# Patient Record
Sex: Female | Born: 1950 | Race: White | Hispanic: No | Marital: Married | Smoking: Never smoker
Health system: Southern US, Community
[De-identification: ages and names within clinical notes are randomized; demographics above are authoritative.]

## PROBLEM LIST (undated history)

## (undated) HISTORY — PX: ABDOMINAL HYSTERECTOMY: SHX81

## (undated) HISTORY — PX: ELBOW FRACTURE SURGERY: SHX616

## (undated) HISTORY — PX: OTHER SURGICAL HISTORY: SHX169

---

## 2009-01-04 ENCOUNTER — Encounter: Payer: Self-pay | Admitting: Emergency Medicine

## 2009-01-04 ENCOUNTER — Ambulatory Visit: Payer: Self-pay | Admitting: Diagnostic Radiology

## 2009-01-05 ENCOUNTER — Inpatient Hospital Stay (HOSPITAL_COMMUNITY): Admission: AD | Admit: 2009-01-05 | Discharge: 2009-01-06 | Payer: Self-pay | Admitting: Orthopaedic Surgery

## 2009-12-14 IMAGING — RF DG ELBOW 2V*R*
1 series · 4 of 4 positions shown · non-contrast
Comparison: 01/04/2009.

CLINICAL DATA: Fracture of the right elbow.

RIGHT ELBOW - 2 VIEW

[Series 1: run · 4 of 4 slices shown]
[im 1/4]
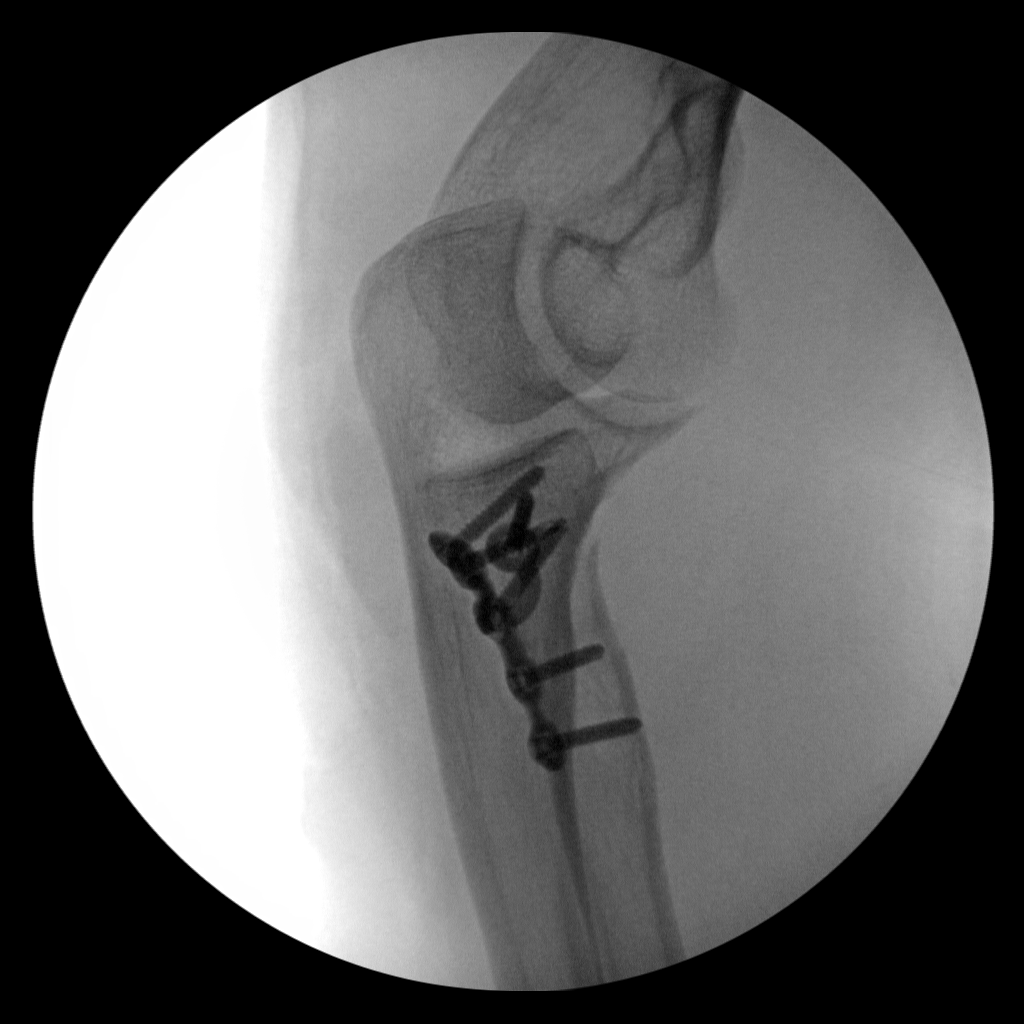
[im 2/4]
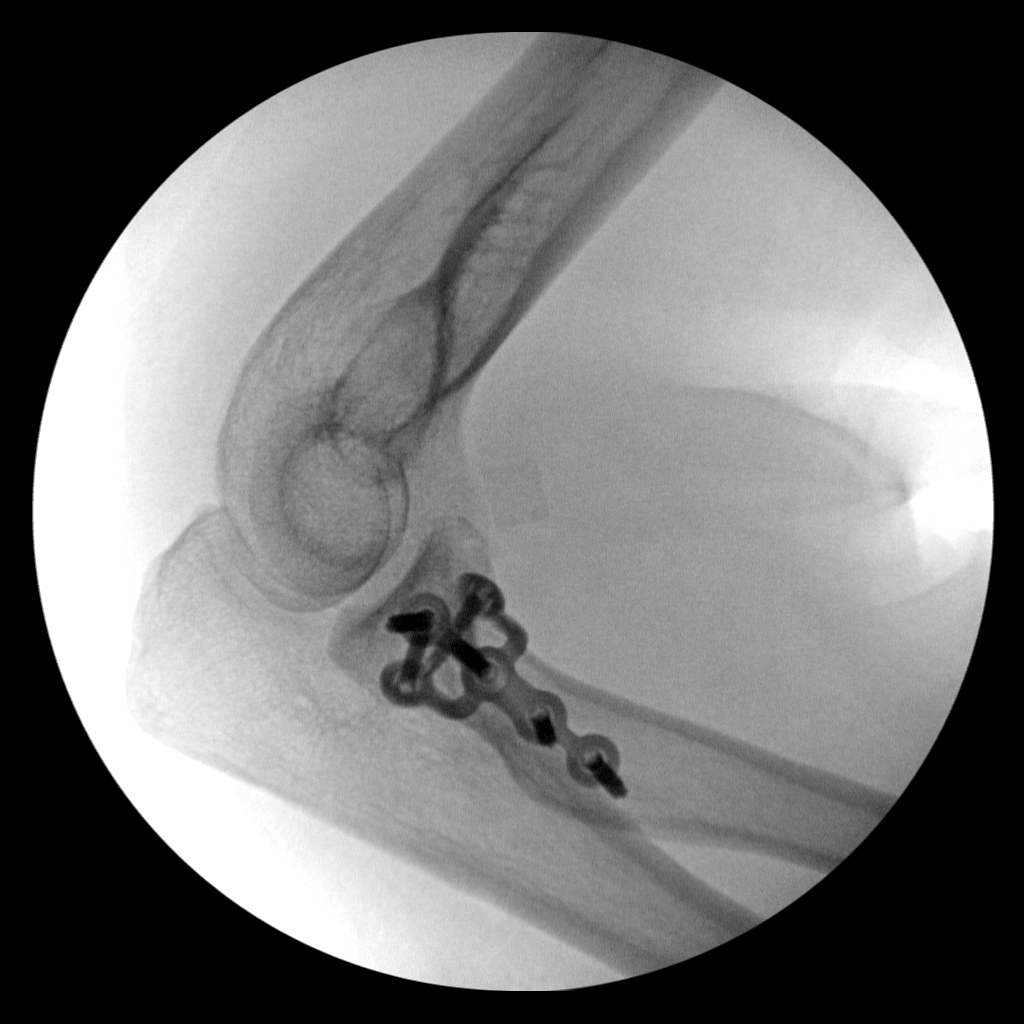
[im 3/4]
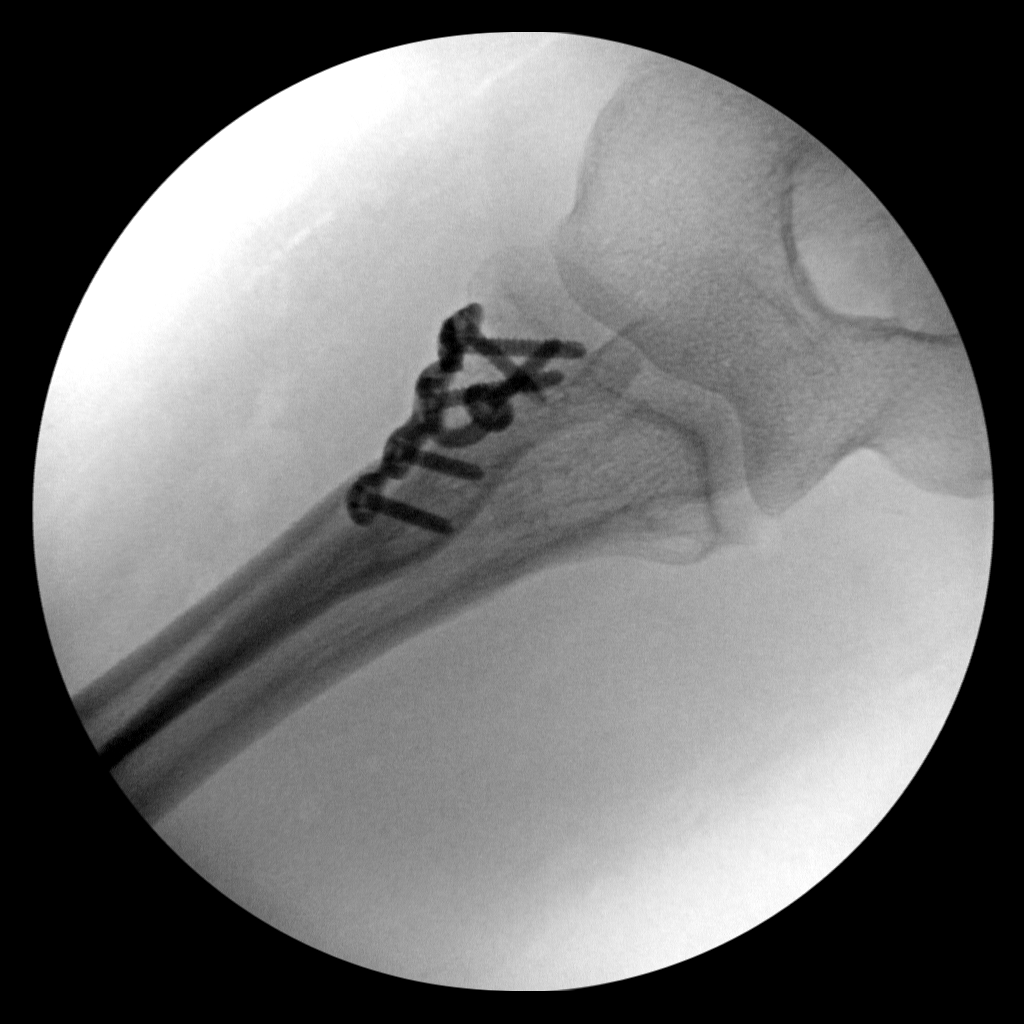
[im 4/4]
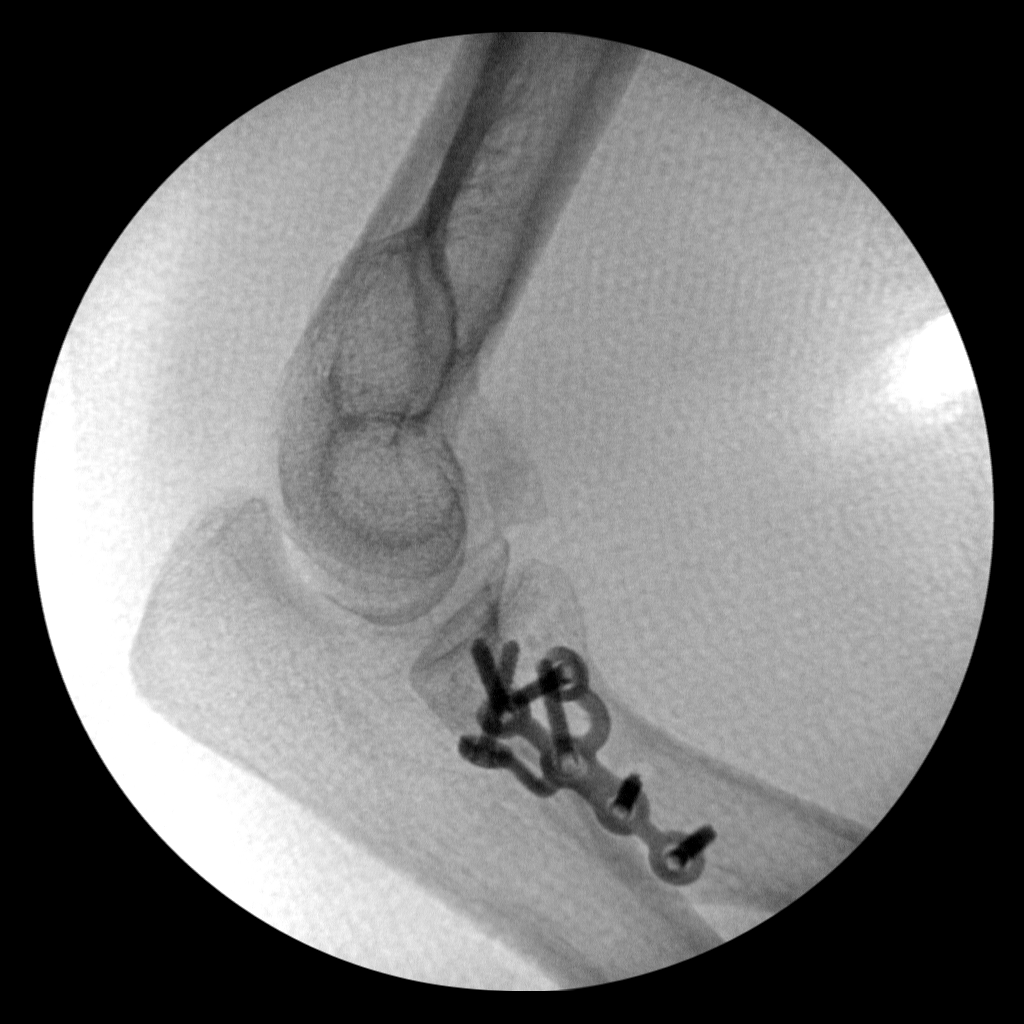

[4 of 4 positions shown; findings below may reference images not displayed]

FINDINGS: ORIF of right radial head fracture is demonstrated on
four intraoperative fluoroscopic spot films.  Alignment is
improved.
IMPRESSION: Right elbow ORIF.

## 2010-08-29 LAB — URINALYSIS, ROUTINE W REFLEX MICROSCOPIC
Bilirubin Urine: NEGATIVE
Ketones, ur: NEGATIVE mg/dL
Nitrite: NEGATIVE
Protein, ur: NEGATIVE mg/dL
pH: 5 (ref 5.0–8.0)

## 2010-08-29 LAB — DIFFERENTIAL
Basophils Absolute: 0.2 10*3/uL — ABNORMAL HIGH (ref 0.0–0.1)
Basophils Relative: 1 % (ref 0–1)
Monocytes Absolute: 0.9 10*3/uL (ref 0.1–1.0)
Neutro Abs: 11.6 10*3/uL — ABNORMAL HIGH (ref 1.7–7.7)
Neutrophils Relative %: 79 % — ABNORMAL HIGH (ref 43–77)

## 2010-08-29 LAB — BASIC METABOLIC PANEL
CO2: 31 mEq/L (ref 19–32)
Calcium: 9.3 mg/dL (ref 8.4–10.5)
Creatinine, Ser: 0.8 mg/dL (ref 0.4–1.2)
GFR calc non Af Amer: >60 mL/min (ref >60)
Glucose, Bld: 108 mg/dL — ABNORMAL HIGH (ref 70–99)

## 2010-08-29 LAB — PROTIME-INR
INR: 1 (ref 0.00–1.49)
Prothrombin Time: 12.7 seconds (ref 11.6–15.2)

## 2010-08-29 LAB — APTT: aPTT: 33 seconds (ref 24–37)

## 2010-08-29 LAB — CBC
MCHC: 33.1 g/dL (ref 30.0–36.0)
Platelets: 378 10*3/uL (ref 150–400)
RDW: 12.9 % (ref 11.5–15.5)

## 2010-08-29 LAB — URINE CULTURE: Colony Count: NO GROWTH

## 2010-10-05 NOTE — Op Note (Signed)
NAMEELAN, Kaylee Scott             ACCOUNT NO.:  [id number]   MEDICAL RECORD NO.:  [id number]          PATIENT TYPE:  INP   LOCATION:  1609                         FACILITY:  [facility]   PHYSICIAN:  Mark C. Ophelia Charter, M.D.    DATE OF BIRTH:  1951-05-21   DATE OF PROCEDURE:  01/05/2009  DATE OF DISCHARGE:                               OPERATIVE REPORT   PREOPERATIVE DIAGNOSIS:  Right elbow dislocation with fracture of radial  head and neck.   POSTOPERATIVE DIAGNOSIS:  Right elbow dislocation with fracture of  radial head and neck.   PROCEDURE:  Reduction right elbow fracture with open reduction internal  fixation of radial head and neck.   SURGEON:  Annell Greening, MD   ANESTHESIA:  General plus 20 mL Marcaine local.   PROCEDURE:  After induction of general anesthesia, surgical time-out  procedure, preoperative vancomycin C-arm was brought in and checked the  elbow with distraction.  It could be reduced.  As the elbow was brought  out toward extension, it would dislocate again and the medial collateral  ligament appeared loose.  The hand was then prepped with pHisoHex  Hibiclens type solution due to the patient's skin sensitivity to  Betadine.  The usual extremity sheets and drapes were applied.  Surgical  time-out checklist was completed, sterile skin marker was used for  planned lateral incision.  The arm was wrapped in Esmarch, tourniquet  inflated.  Lateral incision was made, extensor muscles were split  directly over the radial head and Homans were placed around the neck.  There was comminuted fracture of the head and neck with some shortening.  With Homan behind it, initially bone impactor was used to try to help  straighten it which was not effective.  Two Homans were placed around  the neck and then they were crossed for counter pressure and then with  rotation of the head to the area of maximum deformity, thumb pressure  was used with both my thumbs pushing on the bone and this  pushed the  radial head and neck, back into good position.  Once it was reduced,  DePuy anatomic small plate was selected.  K-wire was placed.  It was  checked under fluoroscopy, adjusted and then holes were drilled.  Additionally the most distal screw had a non tapping screw placed to  suck it down.  Next the remaining locking screws were filled and the  distal-most screw was checked under fluoro, exchanged from a nonlocking  to locking screw since it was about 2 mm too long at 18 mm.  Initial  measurement was based on the fact that the end of the plate was still  slightly off of the radius.  Once it was stuck down and alignment and  position was corrected, multiple spot fluoro pictures were taken showing  satisfactory alignment of the radial head.  With extension that would  pop out again.  The medial collateral ligament was loose from the  dislocation injury and with the joint open under direct visualization,  the joint was reduced flexed 90 and then after irrigation with saline  solution, layered  closure with 2-0 Vicryl in the muscle fascia, 2-0 in  the subcutaneous tissue, 3-0 in the subcutaneous tissue and 4-0  subcuticular skin closure.  Tincture of Benzoin, Steri-Strips, Marcaine  infiltration with 10 mL in the skin incision and 8 mL in the joint.  A  long-arm cast was applied and fluoro spot pictures were taken during the  casting procedure and after cast was applied to make sure that the elbow  was casted  in a reduced position and this was the case.  The patient tolerated the  procedure well, was transferred to the recovery room.  Once the patient  was in the recovery room, cast was split on the dorsal aspect and spread  to allow for any swelling and will be placed in an elevator sling.      Mark C. Ophelia Charter, M.D.  Electronically Signed     MCY/MEDQ  D:  01/05/2009  T:  01/05/2009  Job:  [id number]

## 2016-10-13 ENCOUNTER — Encounter (INDEPENDENT_AMBULATORY_CARE_PROVIDER_SITE_OTHER): Payer: Self-pay | Admitting: Orthopaedic Surgery

## 2016-10-13 ENCOUNTER — Ambulatory Visit (INDEPENDENT_AMBULATORY_CARE_PROVIDER_SITE_OTHER): Payer: Medicare Other | Admitting: Orthopaedic Surgery

## 2016-10-13 VITALS — BP 179/107 | HR 76 | Ht 67.0 in | Wt 230.0 lb

## 2016-10-13 DIAGNOSIS — M170 Bilateral primary osteoarthritis of knee: Secondary | ICD-10-CM

## 2016-10-13 MED ORDER — METHYLPREDNISOLONE ACETATE 40 MG/ML IJ SUSP
40.0000 mg | INTRAMUSCULAR | Status: AC | PRN
  Administered 2016-10-13: 40 mg via INTRA_ARTICULAR

## 2016-10-13 MED ORDER — LIDOCAINE HCL 1 % IJ SOLN
1.0000 mL | INTRAMUSCULAR | Status: AC | PRN
  Administered 2016-10-13: 1 mL

## 2016-10-13 MED ORDER — BUPIVACAINE HCL 0.25 % IJ SOLN
0.6600 mL | INTRAMUSCULAR | Status: AC | PRN
  Administered 2016-10-13: .66 mL via INTRA_ARTICULAR

## 2016-10-13 NOTE — Progress Notes (Signed)
Office Visit Note   Patient: Kaylee MohrJudith Basque           Date of Birth: Dec 16, 1950           MRN: [id number] Visit Date: 10/13/2016              Requested by: No referring provider defined for this encounter. PCP: Patient, No Pcp Per   Assessment & Plan: Visit Diagnoses:  1. Bilateral primary osteoarthritis of knee           With the synovitis.  Plan: bilateral knee injections performed. We discussed various exercises she could do such as swimming, riding and excised by that are less likely to bother her knees. We also discussed doing some core strengthening light upper extremity activities.Returns needed.  Follow-Up Instructions: Return if symptoms worsen or fail to improve.   Orders:  Orders Placed This Encounter  Procedures  . Large Joint Injection/Arthrocentesis  . Large Joint Injection/Arthrocentesis   No orders of the defined types were placed in this encounter.     Procedures: Large Joint Inj Date/Time: 10/13/2016 11:24 AM Performed by: Eldred MangesYATES, Priya Matsen C Authorized by: Annell GreeningYATES, Bettey Muraoka C   Consent Given by:  Patient Indications:  Pain and joint swelling Location:  Knee Site:  R knee Needle Size:  22 G Needle Length:  1.5 inches Approach:  Anterolateral Ultrasound Guidance: No   Fluoroscopic Guidance: No   Arthrogram: No   Medications:  1 mL lidocaine 1 %; 40 mg methylPREDNISolone acetate 40 MG/ML; 0.66 mL bupivacaine 0.25 % Aspiration Attempted: No   Patient tolerance:  Patient tolerated the procedure well with no immediate complications Large Joint Inj Date/Time: 10/13/2016 11:24 AM Performed by: Eldred MangesYATES, Bianey Tesoro C Authorized by: Annell GreeningYATES, Jessiah Steinhart C   Consent Given by:  Patient Indications:  Pain and joint swelling Location:  Knee Site:  L knee Needle Size:  22 G Needle Length:  1.5 inches Approach:  Anterolateral Ultrasound Guidance: No   Fluoroscopic Guidance: No   Arthrogram: No   Medications:  1 mL lidocaine 1 %; 40 mg methylPREDNISolone acetate 40 MG/ML; 0.66 mL  bupivacaine 0.25 % Aspiration Attempted: No   Patient tolerance:  Patient tolerated the procedure well with no immediate complications     Clinical Data: No additional findings.   Subjective: Chief Complaint  Patient presents with  . Left Knee - Pain  . Right Knee - Pain    HPI patient turns for bilateral knee osteoarthritis. Any symptoms of increased she had an injection last year and requested repeat injections in her knees. She is trying to work on increasing activity losing some weight and avoid total knee arthroplasty if possible.  Review of Systems 14 point review of systems updated and unchanged other than history of present illness. She continues been ibuprofen 800 mg.   Objective: Vital Signs: BP (!) 179/107   Pulse 76   Ht 5\' 7"  (1.702 m)   Wt 230 lb (104.3 kg)   BMI 36.02 kg/m   Physical Exam  Constitutional: She is oriented to person, place, and time. She appears well-developed.  HENT:  Head: Normocephalic.  Right Ear: External ear normal.  Left Ear: External ear normal.  Eyes: Pupils are equal, round, and reactive to light.  Neck: No tracheal deviation present. No thyromegaly present.  Cardiovascular: Normal rate.   Pulmonary/Chest: Effort normal.  Abdominal: Soft.  Musculoskeletal:  Patient has no pain with hip range of motion. Negative straight leg raising. Bilateral knee crepitus. 2+ knee synovitis. She has some  difficulty with squatting. Ankle range of motion is normal pulses and normal rash or exposed skin. Iliotibial band is normal. Patellofemoral crepitus bilaterally.  Neurological: She is alert and oriented to person, place, and time.  Skin: Skin is warm and dry.  Psychiatric: She has a normal mood and affect. Her behavior is normal.    Ortho Exam  Specialty Comments:  No specialty comments available.  Imaging: No results found.   PMFS History: There are no active problems to display for this patient.  History reviewed. No pertinent  past medical history.  No family history on file.  Past Surgical History:  Procedure Laterality Date  . ABDOMINAL HYSTERECTOMY    . cataracts     x2  . CESAREAN SECTION     x2  . ELBOW FRACTURE SURGERY     Social History   Occupational History  . Not on file.   Social History Main Topics  . Smoking status: Never Smoker  . Smokeless tobacco: Never Used  . Alcohol use Yes     Comment: occasional  . Drug use: No  . Sexual activity: Not on file

## 2017-06-27 ENCOUNTER — Ambulatory Visit (INDEPENDENT_AMBULATORY_CARE_PROVIDER_SITE_OTHER): Payer: Medicare Other | Admitting: Orthopaedic Surgery

## 2017-06-27 ENCOUNTER — Encounter (INDEPENDENT_AMBULATORY_CARE_PROVIDER_SITE_OTHER): Payer: Self-pay | Admitting: Orthopaedic Surgery

## 2017-06-27 ENCOUNTER — Ambulatory Visit (INDEPENDENT_AMBULATORY_CARE_PROVIDER_SITE_OTHER): Payer: Medicare Other

## 2017-06-27 VITALS — BP 149/76 | HR 78 | Ht 67.0 in | Wt 242.0 lb

## 2017-06-27 DIAGNOSIS — M25561 Pain in right knee: Secondary | ICD-10-CM

## 2017-06-27 DIAGNOSIS — G8929 Other chronic pain: Secondary | ICD-10-CM

## 2017-06-27 DIAGNOSIS — M25562 Pain in left knee: Secondary | ICD-10-CM

## 2017-06-27 DIAGNOSIS — M174 Other bilateral secondary osteoarthritis of knee: Secondary | ICD-10-CM

## 2017-06-27 MED ORDER — BUPIVACAINE HCL 0.5 % IJ SOLN
3.0000 mL | INTRAMUSCULAR | Status: AC | PRN
  Administered 2017-06-27: 3 mL via INTRA_ARTICULAR

## 2017-06-27 MED ORDER — LIDOCAINE HCL 1 % IJ SOLN
0.5000 mL | INTRAMUSCULAR | Status: AC | PRN
  Administered 2017-06-27: .5 mL

## 2017-06-27 MED ORDER — METHYLPREDNISOLONE ACETATE 40 MG/ML IJ SUSP
40.0000 mg | INTRAMUSCULAR | Status: AC | PRN
  Administered 2017-06-27: 40 mg via INTRA_ARTICULAR

## 2017-06-27 MED ORDER — BUPIVACAINE HCL 0.25 % IJ SOLN
4.0000 mL | INTRAMUSCULAR | Status: AC | PRN
  Administered 2017-06-27: 4 mL via INTRA_ARTICULAR

## 2017-06-27 NOTE — Progress Notes (Signed)
Office Visit Note   Patient: Kaylee Scott           Date of Birth: 07-25-1950           MRN: [id number] Visit Date: 06/27/2017              Requested by: No referring provider defined for this encounter. PCP: Patient, No Pcp Per   Assessment & Plan: Visit Diagnoses:  1. Chronic pain of both knees     Plan: Bilateral knee injections performed.  She tolerated the injections well we will check her back again on a as needed basis.  Follow-Up Instructions: No Follow-up on file.   Orders:  Orders Placed This Encounter  Procedures  . XR Knee 1-2 Views Left  . XR Knee 1-2 Views Right   No orders of the defined types were placed in this encounter.     Procedures: Large Joint Inj: R knee on 06/27/2017 3:21 PM Indications: pain and joint swelling Details: 22 G 1.5 in needle, anterolateral approach  Arthrogram: No  Medications: 40 mg methylPREDNISolone acetate 40 MG/ML; 0.5 mL lidocaine 1 %; 4 mL bupivacaine 0.25 % Outcome: tolerated well, no immediate complications Procedure, treatment alternatives, risks and benefits explained, specific risks discussed. Consent was given by the patient. Immediately prior to procedure a time out was called to verify the correct patient, procedure, equipment, support staff and site/side marked as required. Patient was prepped and draped in the usual sterile fashion.   Large Joint Inj: L knee on 06/27/2017 3:21 PM Indications: joint swelling and pain Details: 22 G 1.5 in needle, anterolateral approach  Arthrogram: No  Medications: 0.5 mL lidocaine 1 %; 3 mL bupivacaine 0.5 %; 40 mg methylPREDNISolone acetate 40 MG/ML Outcome: tolerated well, no immediate complications Procedure, treatment alternatives, risks and benefits explained, specific risks discussed. Consent was given by the patient. Immediately prior to procedure a time out was called to verify the correct patient, procedure, equipment, support staff and site/side marked as required.  Patient was prepped and draped in the usual sterile fashion.       Clinical Data: No additional findings.   Subjective: Chief Complaint  Patient presents with  . Right Knee - Pain  . Left Knee - Pain    HPI 67 year old female returns with bilateral knee osteoarthritis.  She has had increased symptoms with her knee had previous injection last year and May 2018 and lasted for greater than 6 months.  She has problems with stairs clicking and popping has not had any locking previous x-rays showed tricompartmental degenerative arthritis which is severe all 3 compartments left knee has multiple loose bodies posteriorly.  She is also had a little bit of pain that she has with ambulation lateral trochanteric region without back pain.  No associated bowel or bladder symptoms no chills or fever no other rheumatologic conditions.  Patient's been swimming to try to keep up her leg strength which she enjoys.  Review of Systems 14 point review of systems updated unchanged from May.  She switched to Aleve instead of ibuprofen that she previously took.   Objective: Vital Signs: BP (!) 149/76   Pulse 78   Ht 5\' 7"  (1.702 m)   Wt 242 lb (109.8 kg)   BMI 37.90 kg/m   Physical Exam  Constitutional: She is oriented to person, place, and time. She appears well-developed.  HENT:  Head: Normocephalic.  Right Ear: External ear normal.  Left Ear: External ear normal.  Eyes: Pupils are equal, round,  and reactive to light.  Neck: No tracheal deviation present. No thyromegaly present.  Cardiovascular: Normal rate.  Pulmonary/Chest: Effort normal.  Abdominal: Soft.  Neurological: She is alert and oriented to person, place, and time.  Skin: Skin is warm and dry.  Psychiatric: She has a normal mood and affect. Her behavior is normal.    Ortho Exam patient has full extension her knees is palpable osteophytes crepitus with knee range of motion 2+ effusion.  Specialty Comments:  No specialty comments  available.  Imaging: Xr Knee 1-2 Views Right  Result Date: 06/27/2017 Standing AP both knees lateral right knee obtained this shows bone-on-bone patellofemoral joint marginal osteophyte some medial shifting of the femur on the tibia and joint space narrowing with subchondral sclerosis. Impression: Severe tricompartmental degenerative arthritis right knee.    PMFS History: There are no active problems to display for this patient.  History reviewed. No pertinent past medical history.  History reviewed. No pertinent family history.  Past Surgical History:  Procedure Laterality Date  . ABDOMINAL HYSTERECTOMY    . cataracts     x2  . CESAREAN SECTION     x2  . ELBOW FRACTURE SURGERY     Social History   Occupational History  . Not on file  Tobacco Use  . Smoking status: Never Smoker  . Smokeless tobacco: Never Used  Substance and Sexual Activity  . Alcohol use: Yes    Comment: occasional  . Drug use: No  . Sexual activity: Not on file

## 2017-10-25 ENCOUNTER — Telehealth (INDEPENDENT_AMBULATORY_CARE_PROVIDER_SITE_OTHER): Payer: Self-pay | Admitting: Orthopaedic Surgery

## 2017-10-25 NOTE — Telephone Encounter (Signed)
Please call patient (left hip pain)and her husband Iantha FallenKenneth (right knee cortisone injection) for appointments tomorrow. CB # [phone]

## 2017-10-26 ENCOUNTER — Encounter (INDEPENDENT_AMBULATORY_CARE_PROVIDER_SITE_OTHER): Payer: Self-pay | Admitting: Orthopaedic Surgery

## 2017-10-26 ENCOUNTER — Ambulatory Visit (INDEPENDENT_AMBULATORY_CARE_PROVIDER_SITE_OTHER): Payer: Medicare Other | Admitting: Orthopaedic Surgery

## 2017-10-26 VITALS — BP 151/83 | HR 77 | Ht 67.0 in | Wt 219.0 lb

## 2017-10-26 DIAGNOSIS — M1711 Unilateral primary osteoarthritis, right knee: Secondary | ICD-10-CM

## 2017-10-26 MED ORDER — LIDOCAINE HCL 1 % IJ SOLN
2.0000 mL | INTRAMUSCULAR | Status: AC | PRN
  Administered 2017-10-26: 2 mL

## 2017-10-26 MED ORDER — BUPIVACAINE HCL 0.5 % IJ SOLN
2.0000 mL | INTRAMUSCULAR | Status: AC | PRN
  Administered 2017-10-26: 2 mL via INTRA_ARTICULAR

## 2017-10-26 MED ORDER — METHYLPREDNISOLONE ACETATE 40 MG/ML IJ SUSP
80.0000 mg | INTRAMUSCULAR | Status: AC | PRN
  Administered 2017-10-26: 80 mg

## 2017-10-26 NOTE — Progress Notes (Signed)
Office Visit Note   Patient: Kaylee Scott           Date of Birth: 06-19-1950           MRN: [id number] Visit Date: 10/26/2017              Requested by: No referring provider defined for this encounter. PCP: Patient, No Pcp Per   Assessment & Plan: Visit Diagnoses:  1. Unilateral primary osteoarthritis, right knee     Plan: Prior diagnosis of osteoarthritis right knee predominant lateral compartment.  Will repeat cortisone injection.  This was performed without difficulty  Follow-Up Instructions: Return if symptoms worsen or fail to improve.   Orders:  Orders Placed This Encounter  Procedures  . Large Joint Inj: R knee   No orders of the defined types were placed in this encounter.     Procedures: Large Joint Inj: R knee on 10/26/2017 1:43 PM Indications: pain and diagnostic evaluation Details: 25 G 1.5 in needle, anterolateral approach  Arthrogram: No  Medications: 2 mL bupivacaine 0.5 %; 2 mL lidocaine 1 %; 80 mg methylPREDNISolone acetate 40 MG/ML Procedure, treatment alternatives, risks and benefits explained, specific risks discussed. Consent was given by the patient. Immediately prior to procedure a time out was called to verify the correct patient, procedure, equipment, support staff and site/side marked as required. Patient was prepped and draped in the usual sterile fashion.       Clinical Data: No additional findings.   Subjective: Chief Complaint  Patient presents with  . Right Knee - Pain  . Follow-up    RIGHT KNEE PAIN WOULD LIKE INJECTION LAST INJ 2/19  Kaylee Scott is a patient of Dr. Ophelia Scott with a history of osteoarthritis right knee.  She last had a cortisone injection in February and wishes to have "another".  She is been having some recurrent pain predominant in the lateral aspect of her knee without injury or trauma. Prior films in February of this year demonstrates severe tricompartmental degenerative arthritis Review of Systems    Constitutional: Negative for fatigue and fever.  HENT: Negative for ear pain.   Eyes: Negative for pain.  Respiratory: Negative for cough and shortness of breath.   Cardiovascular: Negative for leg swelling.  Gastrointestinal: Negative for constipation and diarrhea.  Genitourinary: Negative for difficulty urinating.  Musculoskeletal: Positive for back pain. Negative for neck pain.  Skin: Negative for rash.  Allergic/Immunologic: Negative for food allergies.  Neurological: Negative for weakness and numbness.  Hematological: Does not bruise/bleed easily.  Psychiatric/Behavioral: Positive for sleep disturbance.     Objective: Vital Signs: BP (!) 151/83 (BP Location: Left Arm, Patient Position: Sitting, Cuff Size: Normal)   Pulse 77   Ht 5\' 7"  (1.702 m)   Wt 219 lb (99.3 kg)   BMI 34.30 kg/m   Physical Exam  Constitutional: She is oriented to person, place, and time. She appears well-developed and well-nourished.  HENT:  Mouth/Throat: Oropharynx is clear and moist.  Eyes: Pupils are equal, round, and reactive to light. EOM are normal.  Pulmonary/Chest: Effort normal.  Neurological: She is alert and oriented to person, place, and time.  Skin: Skin is warm and dry.  Psychiatric: She has a normal mood and affect. Her behavior is normal.    Ortho Exam right knee with slight increased valgus with weightbearing.  No obvious effusion.  Multiple varicosities about the knee.  Predominant lateral joint pain.  Some patellar crepitation.  Full extension and over 100 degrees of flexion  Specialty Comments:  No specialty comments available.  Imaging: No results found.   PMFS History: Patient Active Problem List   Diagnosis Date Noted  . Unilateral primary osteoarthritis, right knee 10/26/2017   History reviewed. No pertinent past medical history.  History reviewed. No pertinent family history.  Past Surgical History:  Procedure Laterality Date  . ABDOMINAL HYSTERECTOMY    .  cataracts     x2  . CESAREAN SECTION     x2  . ELBOW FRACTURE SURGERY     Social History   Occupational History  . Not on file  Tobacco Use  . Smoking status: Never Smoker  . Smokeless tobacco: Never Used  Substance and Sexual Activity  . Alcohol use: Yes    Comment: occasional  . Drug use: No  . Sexual activity: Not on file
